# Patient Record
Sex: Female | Born: 1980 | Race: White | Hispanic: Yes | Marital: Married | Smoking: Never smoker
Health system: Southern US, Community
[De-identification: ages and names within clinical notes are randomized; demographics above are authoritative.]

## PROBLEM LIST (undated history)

## (undated) DIAGNOSIS — R42 Dizziness and giddiness: Secondary | ICD-10-CM

## (undated) DIAGNOSIS — D219 Benign neoplasm of connective and other soft tissue, unspecified: Secondary | ICD-10-CM

## (undated) DIAGNOSIS — A549 Gonococcal infection, unspecified: Secondary | ICD-10-CM

## (undated) HISTORY — PX: NO PAST SURGERIES: SHX2092

## (undated) HISTORY — DX: Gonococcal infection, unspecified: A54.9

---

## 2002-04-11 ENCOUNTER — Encounter: Payer: Self-pay | Admitting: *Deleted

## 2002-04-11 ENCOUNTER — Emergency Department (HOSPITAL_COMMUNITY): Admission: EM | Admit: 2002-04-11 | Discharge: 2002-04-11 | Payer: Self-pay | Admitting: *Deleted

## 2003-07-26 ENCOUNTER — Emergency Department (HOSPITAL_COMMUNITY): Admission: EM | Admit: 2003-07-26 | Discharge: 2003-07-26 | Payer: Self-pay | Admitting: Emergency Medicine

## 2005-04-30 ENCOUNTER — Ambulatory Visit (HOSPITAL_COMMUNITY): Admission: AD | Admit: 2005-04-30 | Discharge: 2005-04-30 | Payer: Self-pay | Admitting: Obstetrics and Gynecology

## 2005-05-01 ENCOUNTER — Inpatient Hospital Stay (HOSPITAL_COMMUNITY): Admission: RE | Admit: 2005-05-01 | Discharge: 2005-05-02 | Payer: Self-pay | Admitting: Obstetrics and Gynecology

## 2005-10-15 ENCOUNTER — Emergency Department (HOSPITAL_COMMUNITY): Admission: EM | Admit: 2005-10-15 | Discharge: 2005-10-15 | Payer: Self-pay | Admitting: Emergency Medicine

## 2005-10-24 ENCOUNTER — Emergency Department (HOSPITAL_COMMUNITY): Admission: EM | Admit: 2005-10-24 | Discharge: 2005-10-24 | Payer: Self-pay | Admitting: Emergency Medicine

## 2006-07-07 ENCOUNTER — Emergency Department (HOSPITAL_COMMUNITY): Admission: EM | Admit: 2006-07-07 | Discharge: 2006-07-08 | Payer: Self-pay | Admitting: Emergency Medicine

## 2006-12-12 ENCOUNTER — Other Ambulatory Visit: Admission: RE | Admit: 2006-12-12 | Discharge: 2006-12-12 | Payer: Self-pay | Admitting: Obstetrics and Gynecology

## 2007-06-28 ENCOUNTER — Inpatient Hospital Stay (HOSPITAL_COMMUNITY): Admission: AD | Admit: 2007-06-28 | Discharge: 2007-06-29 | Payer: Self-pay | Admitting: Gynecology

## 2007-06-28 ENCOUNTER — Ambulatory Visit: Payer: Self-pay | Admitting: Gynecology

## 2008-11-04 IMAGING — CT CT ABDOMEN W/O CM
2 of 4 series · 13 of 32 positions shown, 18 images · IV contrast (agent unspecified)
Comparison: none

CLINICAL DATA: Right abdominal pain.  Nausea and vomiting. 
 ABDOMEN CT WITHOUT CONTRAST:
TECHNIQUE: Multidetector CT imaging of the abdomen was performed following the standard protocol without IV contrast.   No IV or oral contrast was administered.
TECHNIQUE: Multidetector CT imaging of the pelvis was performed following the standard protocol without IV contrast.

[Series 2: abd/pel without · axial · non-contrast · 0.70mm/px · z∈[-369,-69]mm · 5 of 90 slices shown, 10 images]
[im 15/90  soft-tissue]
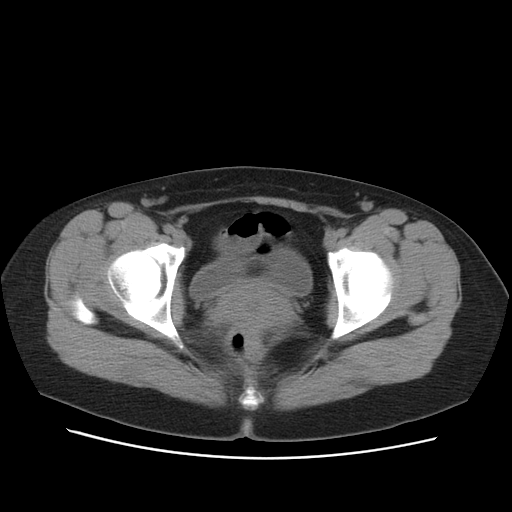
[im 15/90  bone]
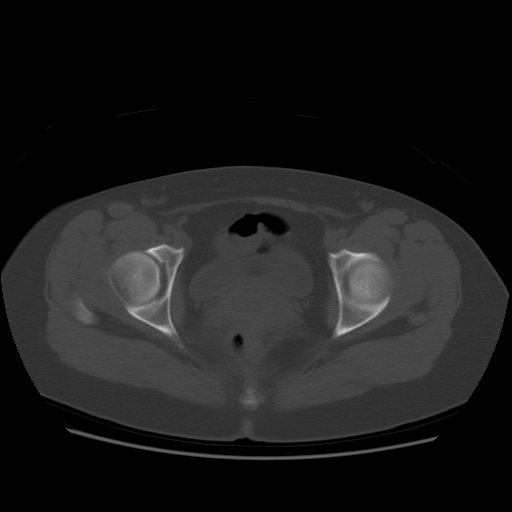
[im 30/90  soft-tissue]
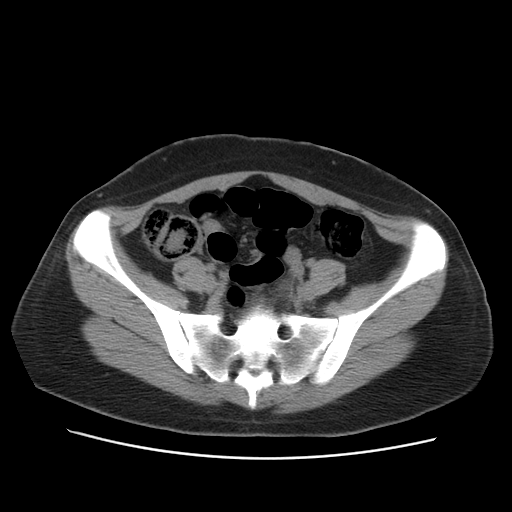
[im 30/90  lung]
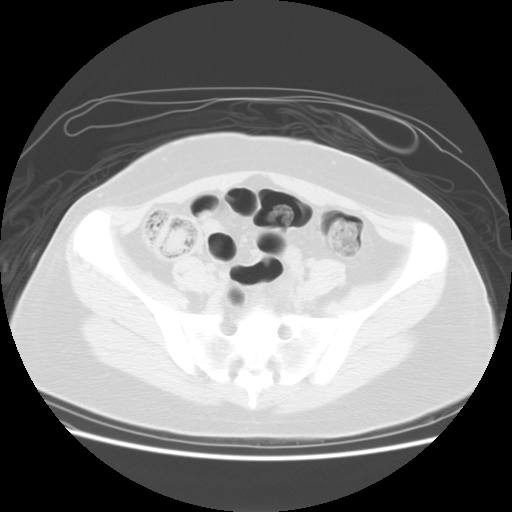
[im 45/90  soft-tissue]
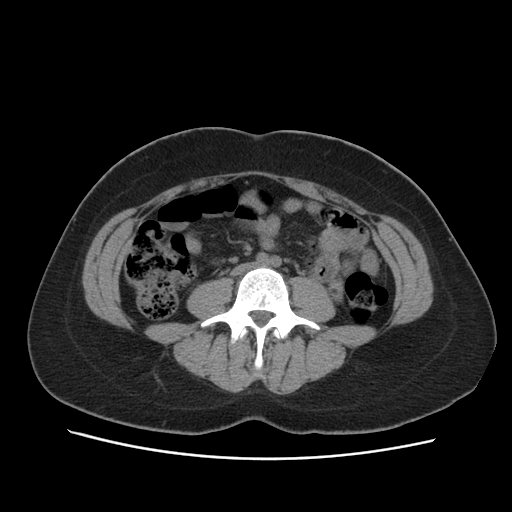
[im 45/90  lung]
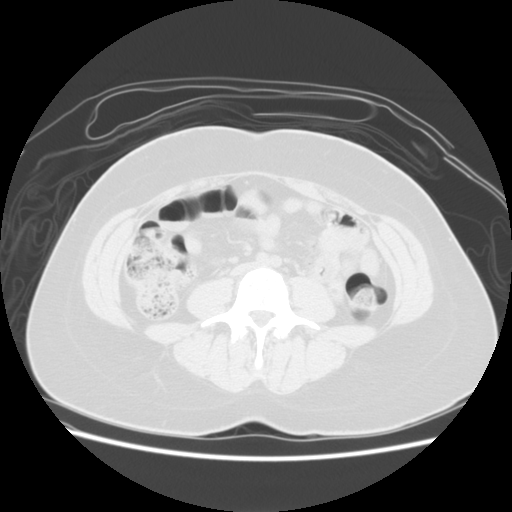
[im 60/90  soft-tissue]
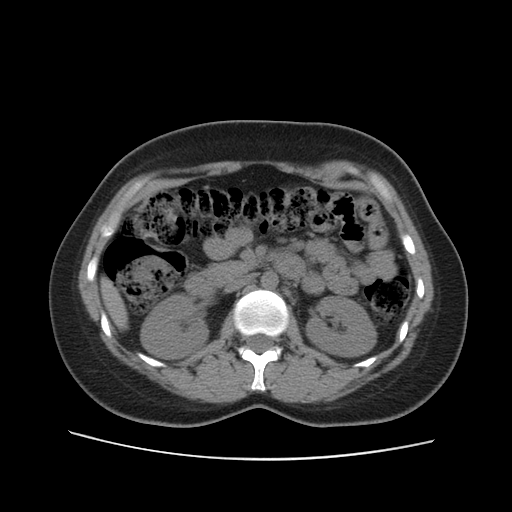
[im 60/90  lung]
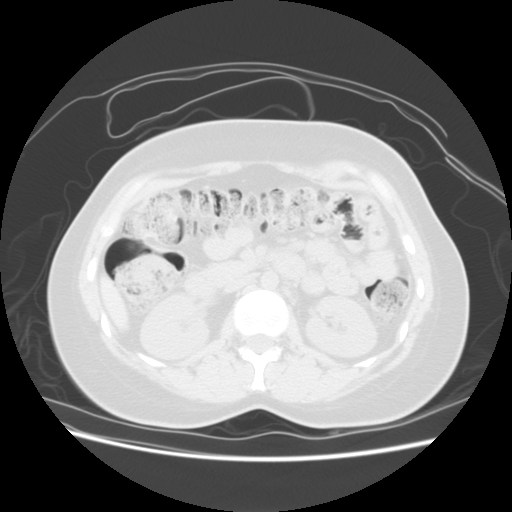
[im 75/90  soft-tissue]
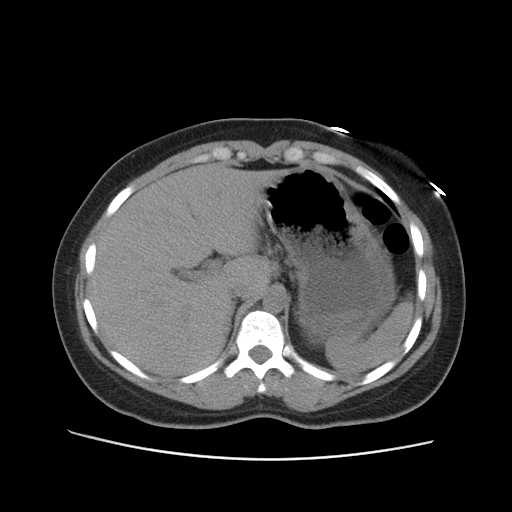
[im 75/90  lung]
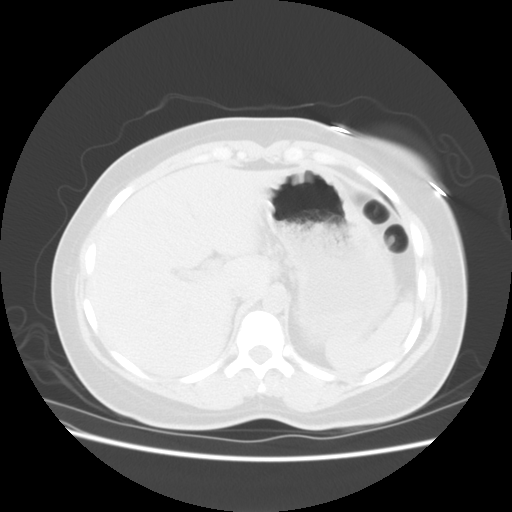

[Series 400: sag · sagittal · 0.70mm/px · 8 of 163 slices shown]
[im 13/163  soft-tissue]
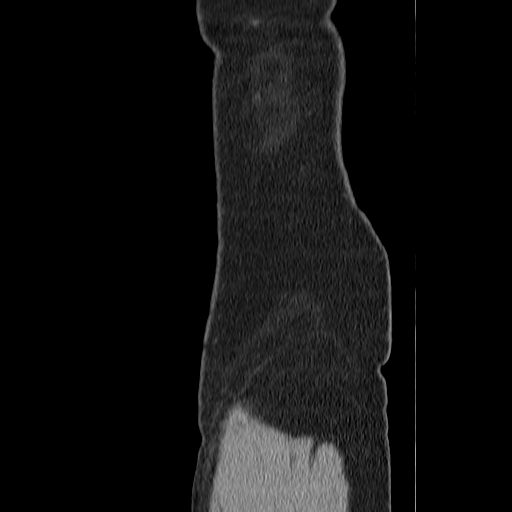
[im 38/163  soft-tissue]
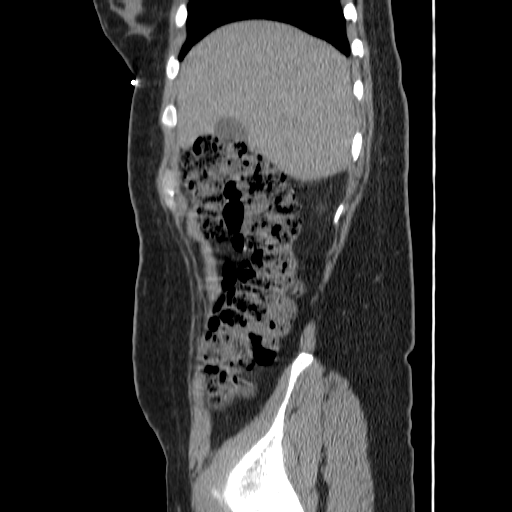
[im 50/163  soft-tissue]
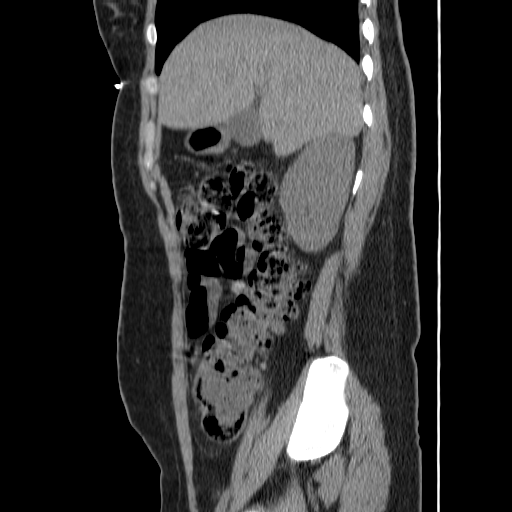
[im 75/163  soft-tissue]
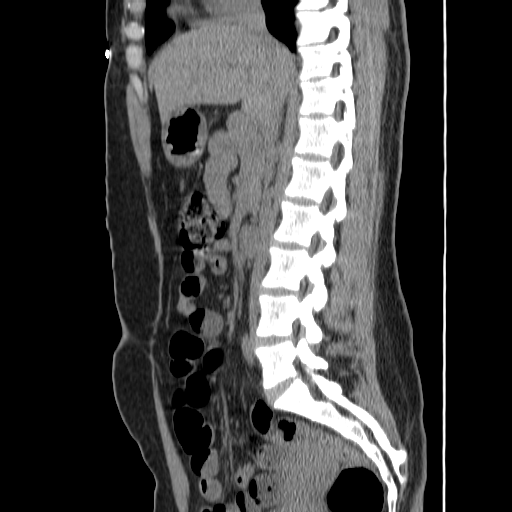
[im 88/163  soft-tissue]
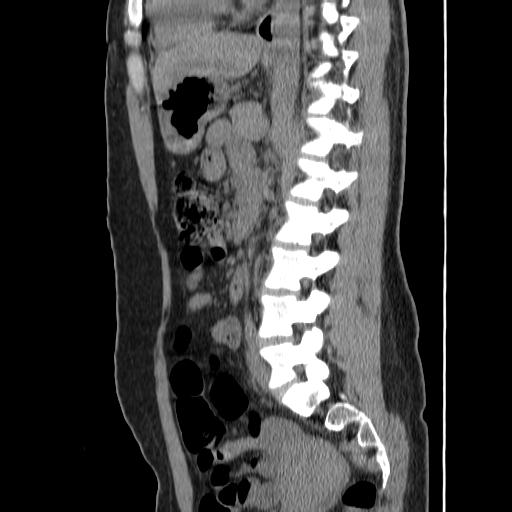
[im 113/163  soft-tissue]
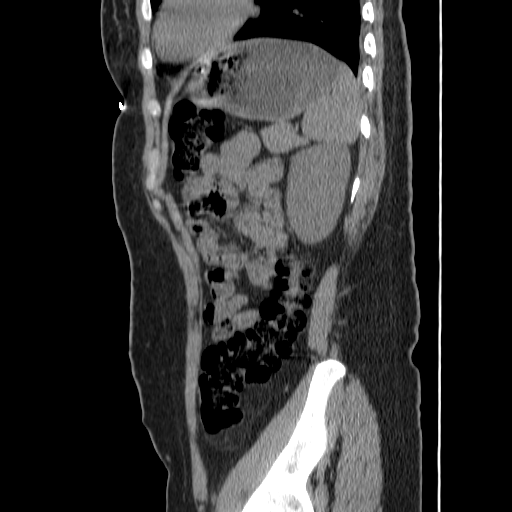
[im 125/163  soft-tissue]
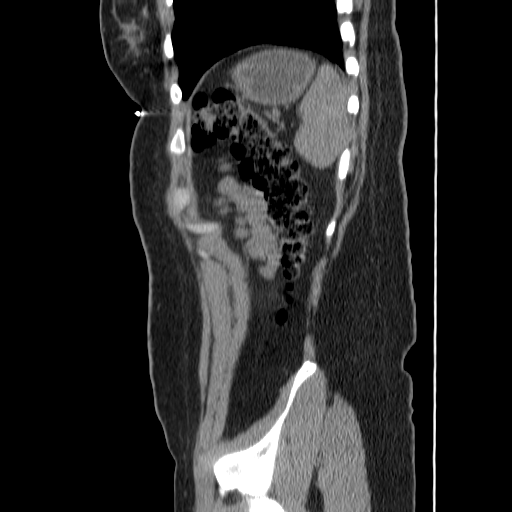
[im 150/163  soft-tissue]
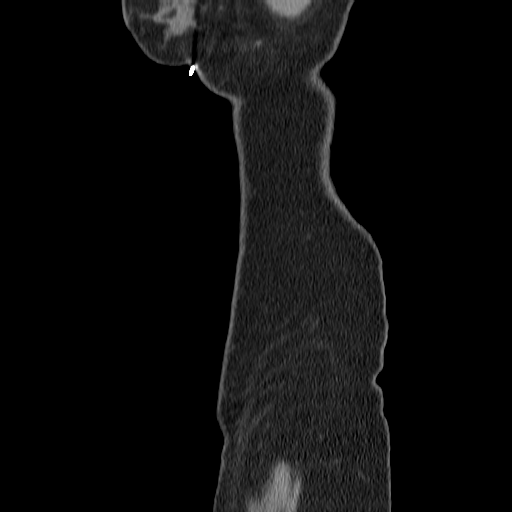

[13 of 32 positions shown; findings below may reference images not displayed]

FINDINGS: Visualized lung bases are unremarkable.  Unenhanced appearance of the solid organs in the abdomen including the liver, gallbladder, pancreas, spleen, adrenal glands, and kidneys are within normal limits.  No renal calculi or obstruction.  No focal inflammatory process is seen.  No free fluid or abscess.  No incidental masses are identified.  Bowel loops are of normal caliber without obstruction.
IMPRESSION: No acute findings on unenhanced abdominal CT. 
 PELVIS CT WITHOUT CONTRAST:
FINDINGS: No abnormal calcifications.  Bladder is unremarkable.  Pelvic bowel loops are of normal caliber.  No focal inflammatory process is seen.  Uterus and adnexal structures are unremarkable by CT.  No free fluid or hernia.
IMPRESSION: No acute findings in the pelvis.

## 2009-03-01 ENCOUNTER — Encounter: Admission: RE | Admit: 2009-03-01 | Discharge: 2009-03-01 | Payer: Self-pay | Admitting: Geriatric Medicine

## 2010-04-24 ENCOUNTER — Encounter: Payer: Self-pay | Admitting: Geriatric Medicine

## 2010-12-26 LAB — RPR: RPR Ser Ql: NONREACTIVE

## 2010-12-26 LAB — CBC
HCT: 38.3
Hemoglobin: 12.4
Hemoglobin: 12.9
MCHC: 33.9
MCV: 84.7
Platelets: 207
RBC: 4.33
RBC: 4.52
WBC: 11.9 — ABNORMAL HIGH
WBC: 12.1 — ABNORMAL HIGH

## 2011-01-12 ENCOUNTER — Other Ambulatory Visit (HOSPITAL_COMMUNITY)
Admission: RE | Admit: 2011-01-12 | Discharge: 2011-01-12 | Disposition: A | Discharge status: Home / Self Care | Payer: 59 | Source: Ambulatory Visit | Attending: Gynecology | Admitting: Gynecology

## 2011-01-12 ENCOUNTER — Ambulatory Visit (INDEPENDENT_AMBULATORY_CARE_PROVIDER_SITE_OTHER): Payer: 59 | Admitting: Gynecology

## 2011-01-12 ENCOUNTER — Encounter: Payer: Self-pay | Admitting: Gynecology

## 2011-01-12 VITALS — BP 140/86 | Ht 59.5 in | Wt 128.0 lb

## 2011-01-12 DIAGNOSIS — Z01419 Encounter for gynecological examination (general) (routine) without abnormal findings: Secondary | ICD-10-CM | POA: Insufficient documentation

## 2011-01-12 DIAGNOSIS — Z113 Encounter for screening for infections with a predominantly sexual mode of transmission: Secondary | ICD-10-CM

## 2011-01-12 NOTE — Progress Notes (Signed)
Tamara Kaiser 1981-01-15 [id number]   History:    30 y.o.  for annual exam new patient to the practice. Patient stated that last week she was being evaluated by family doctor because of her symptoms of borderline hypertension and dizziness. A full battery of blood tests were drawn results pending at time of this dictation. Patient states she's always had normal Pap smear her last gynecological examination was in 2011. She frequently does result this examination. Her menstrual cycles are regular. She is using condoms for contraception.  Past medical history,surgical history, family history and social history were all reviewed and documented in the EPIC chart.  ROS:  Was performed and pertinent positives and negatives are included in the history.  Exam: chaperone present BP 140/86  Ht 4' 11.5" (1.511 m)  Wt 128 lb (58.06 kg)  BMI 25.42 kg/m2  LMP 01/05/2011  Body mass index is 25.42 kg/(m^2).  General appearance : Well developed well nourished female. No acute distress HEENT: Neck supple, trachea midline, no carotid bruits, no thyroidmegaly Lungs: Clear to auscultation, no rhonchi or wheezes, or rib retractions  Heart: Regular rate and rhythm, no murmurs or gallops Breast:Examined in sitting and supine position were symmetrical in appearance, no palpable masses or tenderness,  no skin retraction, no nipple inversion, no nipple discharge, no skin discoloration, no axillary or supraclavicular lymphadenopathy Abdomen: no palpable masses or tenderness, no rebound or guarding Extremities: no edema or skin discoloration or tenderness  Pelvic:  Bartholin, Urethra, Skene Glands: Within normal limits             Vagina: No gross lesions or discharge  Cervix: No gross lesions or discharge  Uterus  anteverted, normal size, shape and consistency, non-tender and mobile  Adnexa  Without masses or tenderness  Anus and perineum  normal   Rectovaginal  normal sphincter tone without palpated  masses or tenderness             Hemoccult not done     Assessment/Plan:  30 y.o. female for annual exam unremarkable. Patient is instructed to continue monthly self breast examination. I have asked her to ask her primary physician to afford [place] a copy of her labs. Test was done today. She is instructed take calcium and vitamin D for osteoporosis prevention. We'll see her back in one year or when necessary.    Ok Edwards MD, 12:30 PM 01/12/2011

## 2011-01-19 ENCOUNTER — Telehealth: Payer: Self-pay | Admitting: *Deleted

## 2011-01-19 DIAGNOSIS — A549 Gonococcal infection, unspecified: Secondary | ICD-10-CM

## 2011-01-19 NOTE — Telephone Encounter (Signed)
Called pt to tell her lab result form office visit 01/12/11, but pt phone was not working.

## 2011-01-20 ENCOUNTER — Encounter: Payer: Self-pay | Admitting: *Deleted

## 2011-01-23 ENCOUNTER — Encounter: Payer: Self-pay | Admitting: *Deleted

## 2011-01-23 MED ORDER — AZITHROMYCIN 1 G PO PACK: 1.0000 | PACK | Freq: Once | ORAL | Status: AC

## 2011-01-23 MED ORDER — CEFIXIME 400 MG PO TABS: 400.0000 mg | ORAL_TABLET | Freq: Every day | ORAL | Status: AC

## 2011-01-23 NOTE — Telephone Encounter (Signed)
Pt has been informed with + lab results per blanca, rx sent to pharmacy, pt informed no sex and test of cure office visit scheduled and treatment for pt as well.

## 2011-02-12 DIAGNOSIS — A549 Gonococcal infection, unspecified: Secondary | ICD-10-CM

## 2011-02-12 HISTORY — DX: Gonococcal infection, unspecified: A54.9

## 2011-02-13 ENCOUNTER — Encounter: Payer: Self-pay | Admitting: Gynecology

## 2011-02-13 ENCOUNTER — Ambulatory Visit (INDEPENDENT_AMBULATORY_CARE_PROVIDER_SITE_OTHER): Payer: 59 | Admitting: Gynecology

## 2011-02-13 VITALS — BP 118/72

## 2011-02-13 DIAGNOSIS — Z113 Encounter for screening for infections with a predominantly sexual mode of transmission: Secondary | ICD-10-CM

## 2011-02-13 DIAGNOSIS — Z708 Other sex counseling: Secondary | ICD-10-CM

## 2011-02-13 NOTE — Progress Notes (Signed)
Patient was seen in the office on October 11 and had a GC and Chlamydia culture and RPR test done the time of her annual exam. See previous encounter note. A GC culture came back positive patient was notified was placed on Zithromax 1 g in Suprax 400 mg. She was asked to return to the office 2 weeks later for test of cure. Her RPR was negative her Pap smear was normal. She had her husband tested and she has informed me that they have told that his GC culture and Chlamydia HIV and RPR and hepatitis were all tests are negative. She states that he has not been unfaithful and she was concerned about the positive GC report. She is asymptomatic today we repeated the GC Chlamydia culture today for the test of cure and she will stop by the labs we can check an HIV along with hepatitis B and hepatitis C. As much as I can tell her as a result of this positive GC culture whether it's a false positive or lab error? Nevertheless there was no vomiting both been treated and she'll return back to the office if she has any symptoms. Will notify her when the results become available later this week.

## 2011-02-13 NOTE — Progress Notes (Signed)
Addended by: Landis Martins R on: 02/13/2011 12:22 PM   Modules accepted: Orders

## 2011-02-14 ENCOUNTER — Ambulatory Visit (INDEPENDENT_AMBULATORY_CARE_PROVIDER_SITE_OTHER): Payer: 59 | Admitting: Gynecology

## 2011-02-14 DIAGNOSIS — Z113 Encounter for screening for infections with a predominantly sexual mode of transmission: Secondary | ICD-10-CM

## 2011-02-14 NOTE — Progress Notes (Signed)
Addended byCammie Mcgee T on: 02/14/2011 11:50 AM   Modules accepted: Orders

## 2011-02-15 LAB — HEPATITIS C ANTIBODY: HCV Ab: NEGATIVE

## 2011-02-15 LAB — HEPATITIS B SURFACE ANTIGEN: Hepatitis B Surface Ag: NEGATIVE

## 2011-06-29 IMAGING — US US ABDOMEN COMPLETE
1 series · 14 of 25 positions shown · non-contrast
Comparison: CT abdomen pelvis 07/08/2006

CLINICAL DATA: Right upper quadrant pain with nausea and vomiting
after meals.

COMPLETE ABDOMINAL ULTRASOUND

[Series 1: us abdomen complete · 0.24mm/px · 14 of 64 slices shown]
[im 1/64]
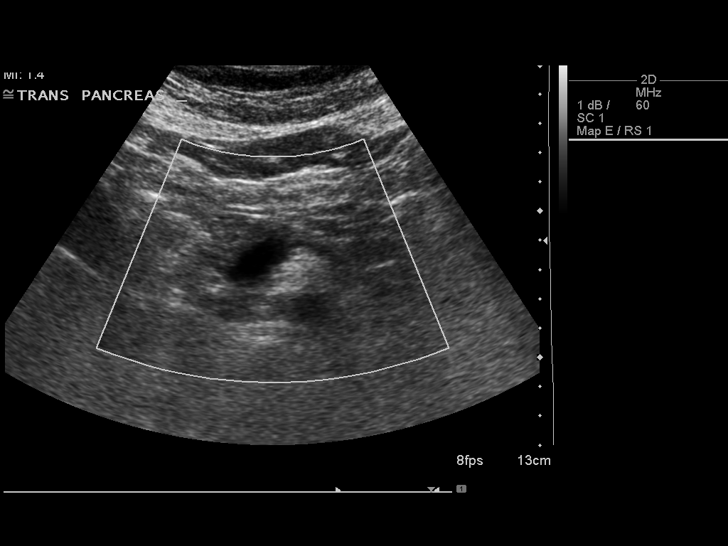
[im 6/64]
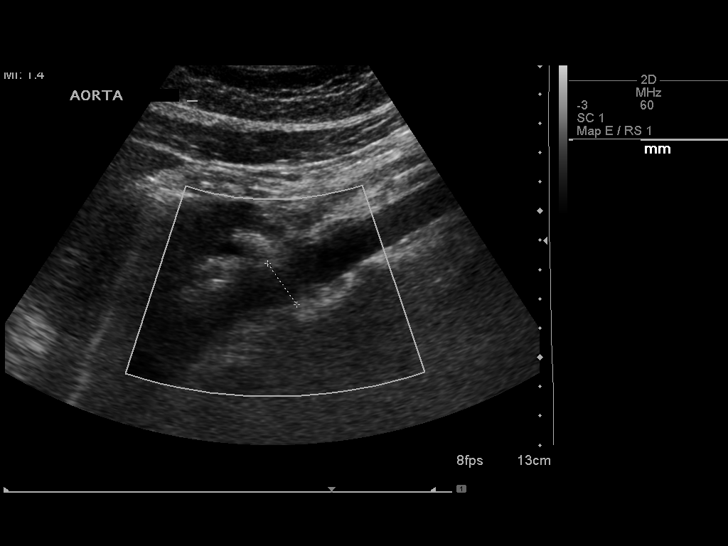
[im 11/64]
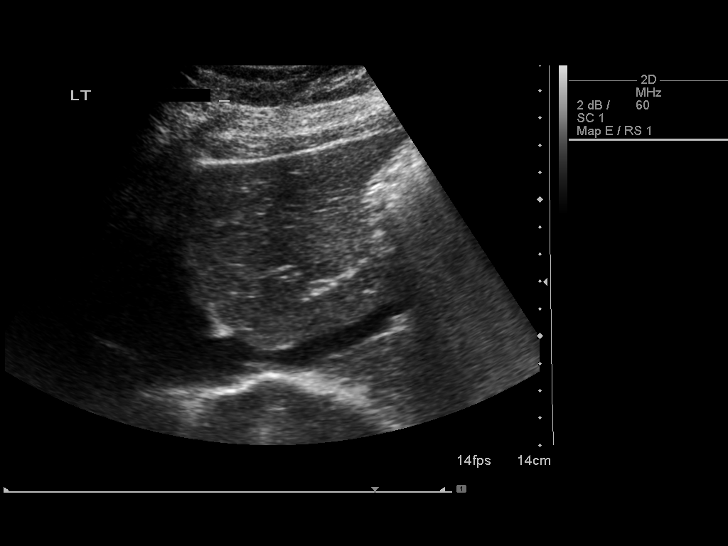
[im 16/64]
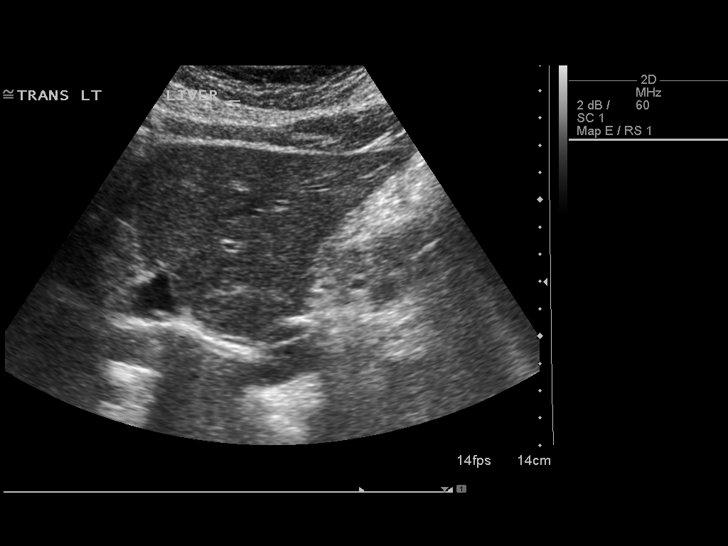
[im 22/64]
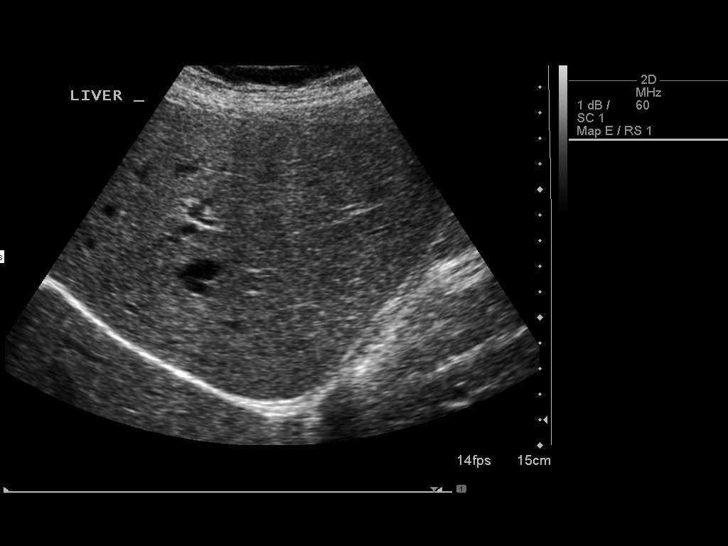
[im 24/64]
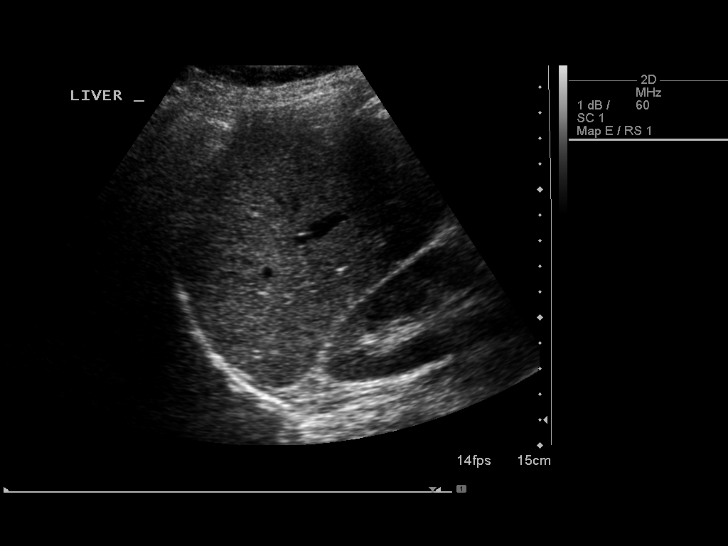
[im 29/64]
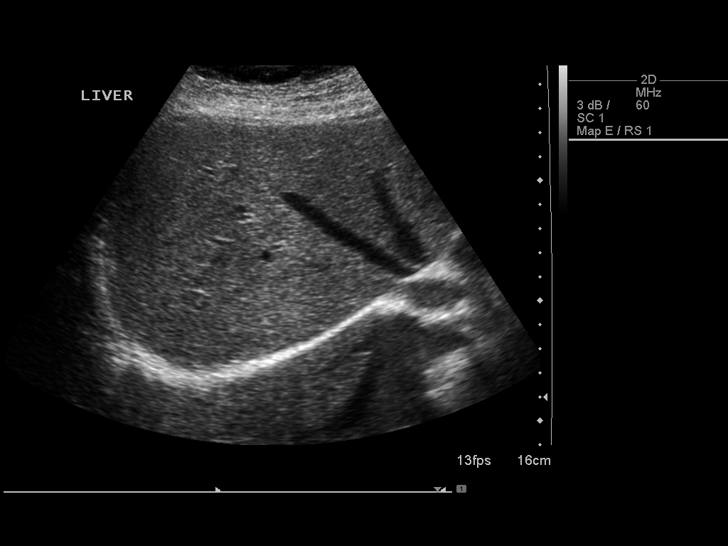
[im 35/64]
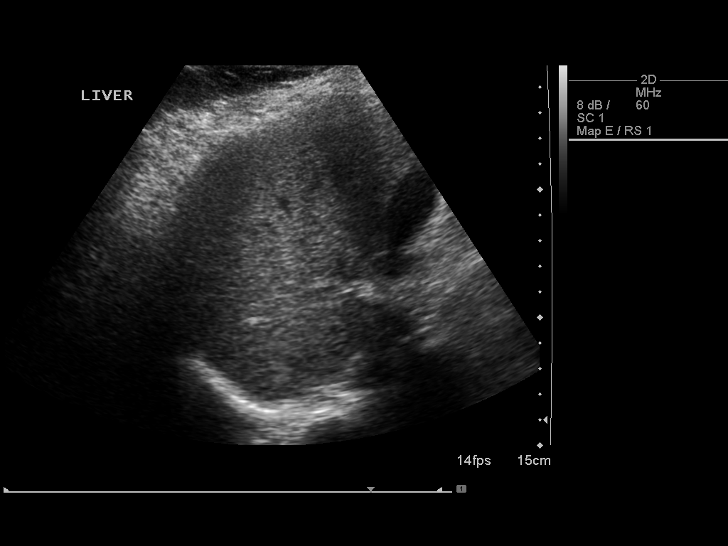
[im 40/64]
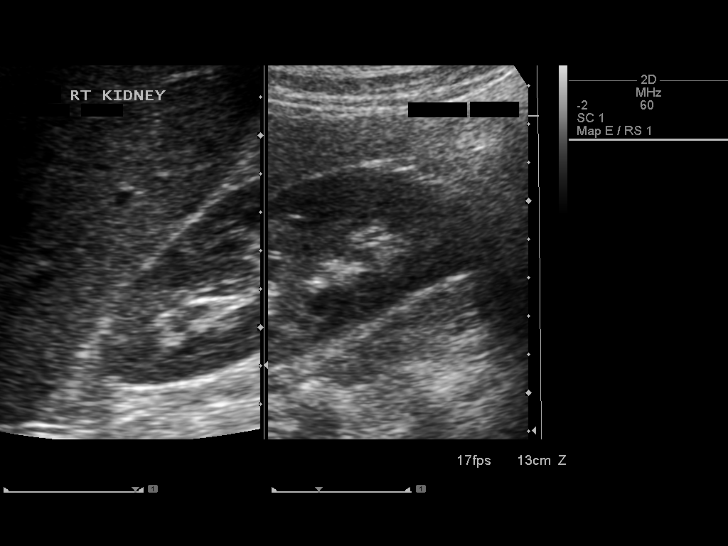
[im 43/64]
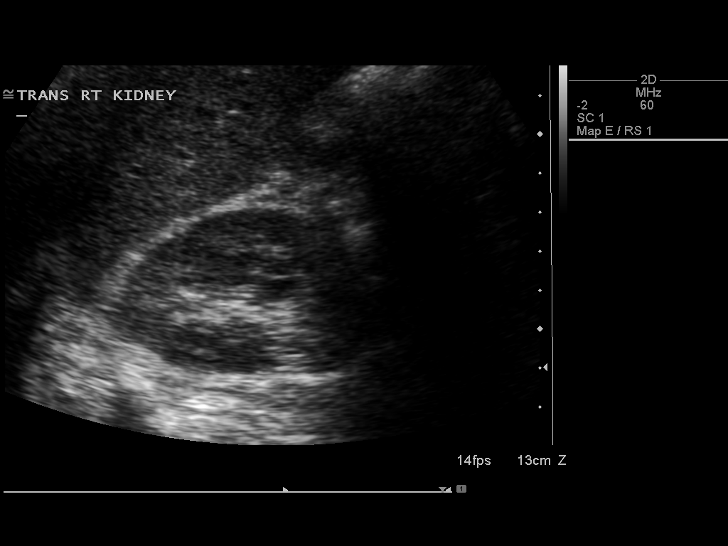
[im 48/64]
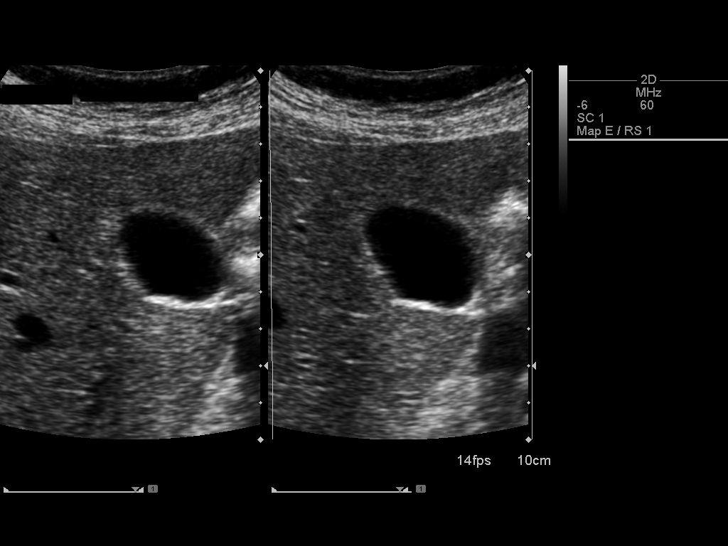
[im 53/64]
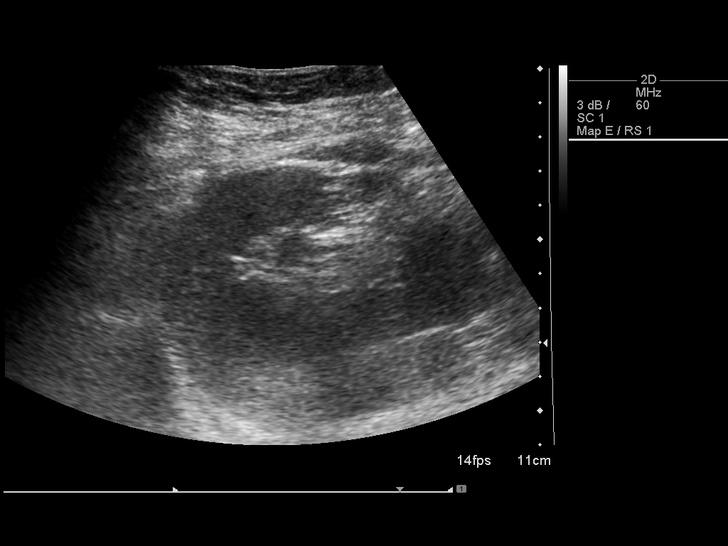
[im 58/64]
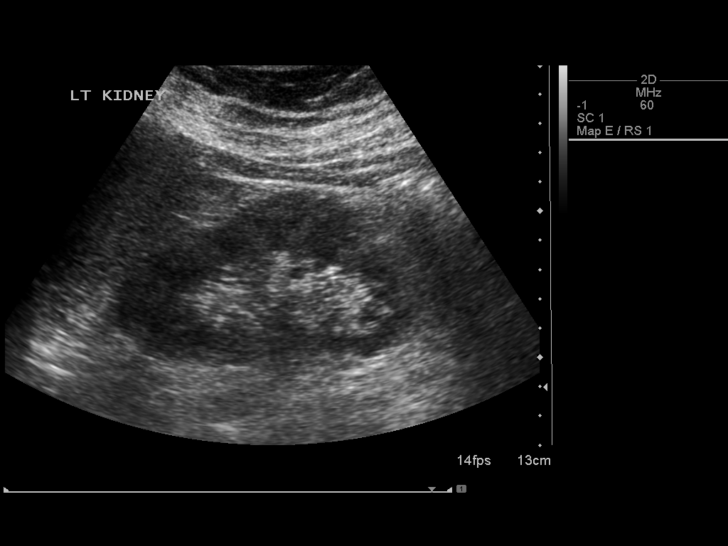
[im 64/64]
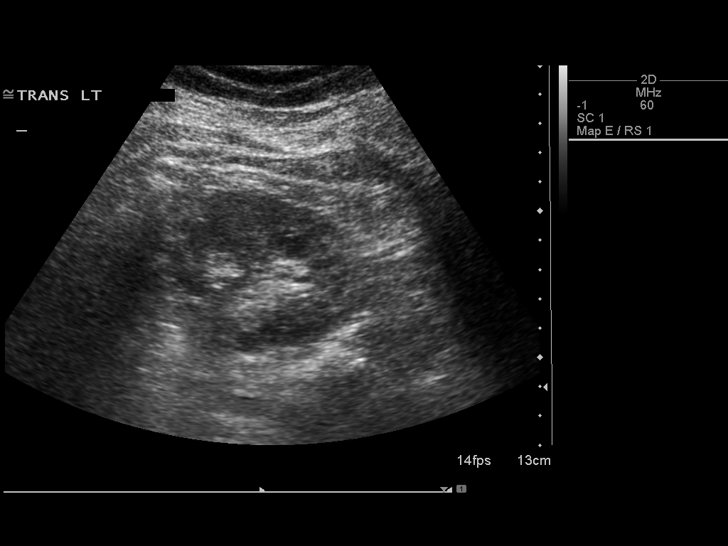

[14 of 25 positions shown; findings below may reference images not displayed]

FINDINGS: Gallbladder:  Negative.

Common bile duct:  Measures 3 mm, within normal limits.

Liver:  Negative.

IVC:  Visualized.

Pancreas:  Negative.

Spleen:  Measures 4.0 cm, negative.

Right Kidney:  Measures 10.7 cm, negative.

Left Kidney:  Measures 10.8 cm, negative.

Abdominal aorta:  No aneurysm identified.
IMPRESSION: Negative abdominal ultrasound.

## 2012-01-17 ENCOUNTER — Encounter: Payer: 59 | Admitting: Gynecology

## 2012-01-24 ENCOUNTER — Ambulatory Visit (INDEPENDENT_AMBULATORY_CARE_PROVIDER_SITE_OTHER): Payer: 59 | Admitting: Gynecology

## 2012-01-24 ENCOUNTER — Encounter: Payer: Self-pay | Admitting: Gynecology

## 2012-01-24 VITALS — BP 110/70 | Ht 59.25 in | Wt 141.0 lb

## 2012-01-24 DIAGNOSIS — R635 Abnormal weight gain: Secondary | ICD-10-CM

## 2012-01-24 DIAGNOSIS — Z01419 Encounter for gynecological examination (general) (routine) without abnormal findings: Secondary | ICD-10-CM

## 2012-01-24 DIAGNOSIS — Z833 Family history of diabetes mellitus: Secondary | ICD-10-CM

## 2012-01-24 DIAGNOSIS — Z113 Encounter for screening for infections with a predominantly sexual mode of transmission: Secondary | ICD-10-CM

## 2012-01-24 LAB — CHOLESTEROL, TOTAL: Cholesterol: 222 mg/dL — ABNORMAL HIGH (ref 0–200)

## 2012-01-24 LAB — CBC WITH DIFFERENTIAL/PLATELET
Basophils Relative: 0 % (ref 0–1)
HCT: 41.3 % (ref 36.0–46.0)
Hemoglobin: 13.8 g/dL (ref 12.0–15.0)
Lymphocytes Relative: 43 % (ref 12–46)
Monocytes Absolute: 0.6 10*3/uL (ref 0.1–1.0)
Monocytes Relative: 8 % (ref 3–12)
Neutro Abs: 3.7 10*3/uL (ref 1.7–7.7)
Neutrophils Relative %: 48 % (ref 43–77)
RBC: 4.69 MIL/uL (ref 3.87–5.11)
WBC: 7.7 10*3/uL (ref 4.0–10.5)

## 2012-01-24 LAB — HEMOGLOBIN A1C
Hgb A1c MFr Bld: 5.7 % — ABNORMAL HIGH (ref <5.7)
Mean Plasma Glucose: 117 mg/dL — ABNORMAL HIGH (ref <117)

## 2012-01-24 NOTE — Patient Instructions (Signed)
Auto examen de mama (Breast Self-Exam) El auto examen puede ayudarla a encontrar modificaciones o trastornos en la mama cuando todava son pequeos. Haga el auto examen de la mama:  [employer].   Una semana despus de su perodo (ciclo menstrual o periodo menstrual).   El Social worker de cada mes, si ya no tiene el perodo.  Debe estar atenta a:  Cambios en el color, tamao o forma de la mama.   Hoyuelos en los senos.   Modificaciones en los pezones o la piel.   Sequedad en la piel de los senos o los pezones.   Secreciones acuosas o sanguinolentas en los pezones.  Trate de sentir la presencia de:  Bultos.   Durezas.   Cualquier otro cambio.  CUIDADOS EN EL HOGAR  Hay 3 formas de hacer el examen autoexamen de mama: Prese frente a un espejo.  Levante los brazos por arriba de la cabeza y gire de un lado al otro.   Coloque las [employer] caderas e inclnese hacia abajo y luego gire de un lado al otro.   Inclnese hacia delante y gire de un lado al otro.  En la ducha.  Con las manos enjabonadas, revise los [employer]. Luego controle por [place] y por debajo de la clavcula y las [place].   Pase los dedos por la zona superior e inferior de la clavcula hasta debajo del seno, y desde el centro del pecho hasta el borde exterior de Management consultant. Controle si hay bultos o zonas duras.   Utilizando las yemas de tres dedos del medio revise todo su seno presionando la mano sobre el [place], haciendo crculos o movimientos hacia arriba y [employer].  Acostada.  Acustese sobre su cama.   Coloque una almohada pequea debajo de la mama que va a Chief Operating Officer. En esa misma posicin, ponga la mano detrs de la cabeza.   Con la Yahoo, use los 3 dedos del medio para Public house manager.   Mueva los dedos en un crculo alrededor de la mama. Presione firmemente sobre todas las partes de la mama para Engineer, manufacturing bultos.  SOLICITE AYUDA DE INMEDIATO SI: Encuentra algn cambio para que puedan  realizarle un estudio. Document Released: 04/22/2010 Document Revised: 03/09/2011 [facility] Patient Information 2012 [place], [place].  Vacuna difteria/ttanos (Td) o [place] difteria, ttanos, tos convulsa (Tdap), Lo que debe saber (Tetanus, Diphtheria [Td] or Tetanus, Diphtheria, Pertussis [Tdap] Vaccine, What You Need to Know) PORQU VACUNARSE? El ttanos , la difteria y la tos ferina pueden ser enfermedades graves.  El TTANOS  (trismo) provoca la contraccin dolorosa y rigidez de los msculos, por lo general, en todo el cuerpo.   Puede causar la contraccin de los msculos de la cabeza y el cuello de modo que el enfermo no puede abrir la boca ni tragar., y en algunos casos, tampoco puede respirar.. El ttanos causa la muerte de 1 de cada 5 personas que se infectan.  LA DIFTERIA produce la formacin de una membrana gruesa que cubre el fondo de la garganta.  Puede causar problemas respiratorios, parlisis, insuficiencia cardaca, e incluso la muerte.  El PERTUSIS (tos [place]) causa ataques de tos intensa que pueden dificultar la respiracin, provocar vmitos e interrumpir el sueo.   Puede causar prdida de peso, incontinencia, fractura de [place], y desmayos por la intensa tos. Hasta de 2 de cada 100 adolescentes y 5 de cada 100 adultos que enferman de tos [place] deben ser hospitalizados o tienen complicaciones como la neumona y  la muerte.  Estas 3 enfermedades son provocadas por bacterias. La difteria y la tos Benetta Spar se [place] de persona a Social worker. El ttanos ingresa al organismo a travs de cortes, rasguos o heridas. En los [place] ocurran alrededor de 200 000 casos por ao de difteria y tos [place], antes de que existieran las [place], y tambin ocurran cientos de casos de ttanos. Desde la aparicin de las vacunas, el ttanos y la difteria han disminuido en alrededor del 99% y los casos de tos ferina disminuyeron aproximadamente el 92%.  Los nios menores de 6 aos deben  recibir la vacuna DTaP para estar protegidos contra estas tres enfermedades. Pero los Abbott Laboratories, los adolescentes y los adultos tambin necesitan proteccin. VACUNAS PARA ADOLESCENTES Y ADULTOS Vacunas Tdap y Td  Hay dos vacunas disponibles para proteger de estas enfermedades a nios a Glass blower/designer de los 7aos:   La vacuna Td fue utilizada durante muchos aos. Protege contra el ttanos y la difteria.   La vacuna Tdap fue autorizada en 2005. Es la primera vacuna para adolescentes y adultos que protege contra la tos ferina y el ttanos y la difteria.  Una dosis de refuerzo de la Td se recomienda cada 10 aos. La Tdap se aplica slo una vez.  QU VACUNA DEBO APLICARME Y CUANDO? Las edades de 7 a 18 aos  Dynegy 11 y los 12 aos se recomienda una dosis de Tdap. Esta dosis puede aplicarse desde los 7 aos en los nios que no han recibido una o ms dosis de DTaP anteriormente.   Los nios y adolescentes que no recibieron todas las dosis programadas de DTaP o DTP a los 7 aos deben completar la serie usando una combinacin de Td y Tdap.  Adultos de 19 aos o ms  [employer] adultos deben recibir una dosis de refuerzo de Td cada 10 aos. Los adultos de menos de 65 aos que nunca hayan recibido la Tdap deben reemplazarla por la siguiente dosis de refuerzo. Los adultos a partir de los 65 aos puedenrecibir una dosis de Tdap.   Los adultos (incluyendo las mujeres que podran quedar embarazadas y los adultos mayores de 65 aos) que tienen contacto cercano con un beb menor de 12 meses deben aplicarse una dosis de Tdap para proteger al beb de la tos [place].   Los trabajadores de la salud que tengan contacto directo con pacientes en hospitales o clnicas deben recibir una dosis de Tdap.  Proteccin despus de [place] herida  Es posible que una persona que tenga un corte o quemadura grave necesite una dosis de Td o Tdap para prevenir la infeccin por ttanos. Puede usarse la Tdap en personas que nunca  recibieron una dosis. Pero debe usarse la Td, si la Tdap no se encuentra disponible, o para:   Cualquier persona que haya recibido una dosis de Tdap.   Los nios [employer] 7 y los 9 aos que han [employer].H. [employer] series de DTap anteriormente.   Adultos de 65 aos o ms.  Mujeres embarazadas.   Las mujeres embarazadas que nunca recibieron una dosis de Ddap deben recibirla despus de la 20a semana de gestacin y preferiblemente durante Contractor. trimestre. Si no se aplican la Tdap durante el embarazo, deben recibirla lo antes posible despus del parto. Las mujeres embarazadas que han recibido la Tdap y tienen que aplicarse la vacuna contra el ttanos o la difteria durante el [place], deben recibir la Td.  Las vacunas Tdap y Td pueden ser administradas al  mismo tiempo que otras vacunas. ALGUNAS PERSONAS NO DEBEN RECIBIR LA VACUNA O DEBEN [employer]lett[employer]ard  Las personas que hayan tenido una reaccin alrgica que haya puesto en peligro su vida despus de una dosis de vacuna contra el ttanos, la difteria o la tos ferina no deben recibir Td ni Tdap..   Las personas que tengan alergias graves a algn componente de una vacuna no deben recibir esa vacuna. Informe a su mdico si la persona que recibe la vacuna sufre alergias graves.   Cualquier persona que American Standard Companies en coma o que haya tenido convulsiones dentro de los 7 [address] posteriores despus de una dosis de DTP o DTaP no debe recibir la Tdap, salvo que se encuentre una causa que no fuera la vacuna. Estas personas pueden recibir Td.   Consulte a su mdico si la persona que recibe [place] de las vacunas:   Tiene epilepsia o algn otro problema del sistema nervioso.   Tuvo inflamacin o dolor intenso despus de una dosis de DTP, DTaP, DT, Td, o Tdap.   Ha tenido el sndrome de Scientific laboratory technician (GBS por sus siglas en ingls).  Las personas que sufran una enfermedad moderada o grave el da en que se programa la vacuna, deben esperar a recuperarse para recibir las  vacunas Tdap o Td. Por lo general, una persona con una enfermedad leve o fiebre baja puede recibir la vacuna. CULES SON LOS RIESGOS DE LAS VACUNAS TDAP Y TD? Con Cathleen Corti, al igual que con cualquier Automatic Data, siempre existe un pequeo riesgo de una reaccin alrgica que ponga en peligro la vida o cause otro problema grave. Todo procedimiento mdico, inclusive la vacunacin pueden causar breves episodios de lipotimia o sntomas relacionados (como movimientos espasmdicos). Para evitar los [employer] y las lesiones causadas por las cadas, permanezca sentado o recustese durante los 15 minutos posteriores a la vacunacin. Informe a su mdico si el paciente se siente dbil o mareado, tiene cambios en la visin o siente zumbidos en los odos.  Es mucho ms probable que tener ttanos, difteria, o tos ferina cause problemas ms graves que los provocados por recibir cualquiera de las vacunas Td o Tdap. A continuacin se enumeran los problemas informados despus de las vacunas Td y Tdap. Problemas Leves (perceptibles, pero que no interfirieron con las actividades): Tdap  Dolor (alrededor de 3 de cada 4 adolescentes y 2 de cada 3 adultos).   Enrojecimiento o inflamacin en el sitio de la inyeccin (alrededor de 1 de cada 5).   Fiebre leve de al menos 100.4 F (38 C) (hasta alrededor de 1 cada 25 adolescentes y 1 de cada 100 adultos).   Dolor de cabeza (alrededor de 4 de cada 10 adolescentes y 3 de cada 10 adultos).   Cansancio (alrededor de 1 de cada 3 adolescentes y 1 de cada 4 adultos).   Nuseas, vmitos, diarrea, o dolor de estmago (hasta 1 de cada 4 adolescentes y 1 de cada 10 adultos).   Escalofros, dolores corporales, dolor articular, erupciones, o inflamacin de las glndulas (poco frecuente).  Td  Dolor (hasta alrededor de 8 de cada 10).   Enrojecimiento o inflamacin de la inyeccin (alrededor de 1 de cada 3).   Fiebre leve (hasta alrededor de 1 de cada 5).   Dolor de cabeza o  cansancio (poco frecuente).  Problemas Moderados (interfieren con las [place], [place] no requieren atencin mdica): Tdap  Dolor en el sitio de la inyeccin (alrededor de 1 de cada 20 adolescentes y 1 de cada 100  adultos).   Enrojecimiento o inflamacin de la inyeccin (alrededor de 1 de cada 16 adolescentes y 1 de cada 25 adultos).   Fiebre de ms de 102 F (38.9 C) (alrededor de 1 de cada 100 adolescentes y 1 de cada 250 adultos).   Dolor de cabeza (1 de cada 300).   Nuseas, vmitos, diarrea, o dolor de estmago (hasta 3 de cada 100 adolescentes y 1 de cada 100 adultos).  Td  Fiebre de ms de 102 F (38.9 C) (poco comn).  Tdap o Td  Inflamacin de gran extensin en el brazo en el que se aplic la vacuna (hasta 3 de cada 100).  Problemas [place] (no puede realizar [employer]; requiere Psychologist, prison and probation services) Tdap o Td  Inflamacin, dolor intenso, sangrado y enrojecimiento en el brazo, en el sitio de la inyeccin (poco frecuente).  Puede producirse una reaccin alrgica grave despus de cualquier vacuna. Se estima que estas reacciones ocurren en menos de una de cada un milln de dosis. QU PASA SI HAY UNA REACCIN GRAVE? Qu signos debo buscar? Cualquier estado poco habitual, como una reaccin alrgica grave o fiebre alta. Si le produce Runner, broadcasting/film/video grave, se manifestar dentro de algunos minutos a una hora despus de recibir la vacuna. Entre los signos de Automotive engineer grave se encuentran la dificultad para respirar, debilidad, ronquera o sibilancias, latidos cardacos acelerados, urticaria, mareos, palidez, o inflamacin de la garganta. Qu debo hacer?  Comunquese con su mdico o lleve inmediatamente a la persona al mdico.   Dgale a su mdico qu ocurri, la fecha y hora en que sucedi y cundo le aplicaron la vacuna.   Pida a su mdico que informe sobre la reaccin llenando un formulario del Sistema de Informacin de Reacciones Adversos a las Administrator, arts  (VAERS, por sus siglas en ingls). O, puede presentar este informe a travs del sitio web de VAERS [email or web address].[email or web address].no o puede llamar al [phone].  VAERS no brinda asistencia mdica. PROGRAMA NACIONAL DE COMPENSACIN DE DAOS POR VACUNAS El [place] de Compensacin de Daos por Vacunas (VICP) fue creado en 1986.  Aquellas personas que consideren que han sufrido un dao como consecuencia de una vacuna y quieren saber ms acerca del programa y como presentar Roslynn Amble, [place] llamar al [phone] o visitar su sitio web en SpiritualWord.[email or web address]  CMO Roxan Diesel MS INFORMACIN?  El profesional podr darle el prospecto de la vacuna o sugerirle otras fuentes de informacin.   Comunquese con el servicio de salud de su localidad o [address].   Comunquese con los Centros para el control y la prevencin de Child psychotherapist for Disease Control and Prevention , CDC).   Llame al [phone] (1-800-CDC-INFO).   Visite los sitios web de Energy Transfer Partners en PicCapture.uy  CDC Td and Tdap Interim VIS-Spanish (04/26/10) Document Released: 07/06/2008 Document Revised: 03/09/2011 [facility] Patient Information 2012 [place], [place].

## 2012-01-24 NOTE — Progress Notes (Signed)
Tamara Kaiser [birth year] [id number]   History:    31 y.o.  for annual gyn exam with no complaints today. She is using condoms for contraception. Her cycles are regular. She requests STD screen. She in frequently does her self breast examination. Patient with no prior history of normal Pap smear. Patient's sister is insulin-dependent diabetic.  Past medical history,surgical history, family history and social history were all reviewed and documented in the EPIC chart.  Gynecologic History Patient's last menstrual period was 01/18/2012. Contraception: condoms Last Pap: 2012. Results were: normal Last mammogram: Not indicated. Results were: Not indicated  Obstetric History OB History    Grav Para Term Preterm Abortions TAB SAB Ect Mult Living   2 2        2      # Outc Date GA Lbr Len/2nd Wgt Sex Del Anes PTL Lv   1 PAR     M SVD      2 PAR     F SVD          ROS: A ROS was performed and pertinent positives and negatives are included in the history.  GENERAL: No fevers or chills. HEENT: No change in vision, no earache, sore throat or sinus congestion. NECK: No pain or stiffness. CARDIOVASCULAR: No chest pain or pressure. No palpitations. PULMONARY: No shortness of breath, cough or wheeze. GASTROINTESTINAL: No abdominal pain, nausea, vomiting or diarrhea, melena or bright red blood per rectum. GENITOURINARY: No urinary frequency, urgency, hesitancy or dysuria. MUSCULOSKELETAL: No joint or muscle pain, no back pain, no recent trauma. DERMATOLOGIC: No rash, no itching, no lesions. ENDOCRINE: No polyuria, polydipsia, no heat or cold intolerance. No recent change in weight. HEMATOLOGICAL: No anemia or easy bruising or bleeding. NEUROLOGIC: No headache, seizures, numbness, tingling or weakness. PSYCHIATRIC: No depression, no loss of interest in normal activity or change in sleep pattern.     Exam: chaperone present  BP 110/70  Ht 4' 11.25" (1.505 m)  Wt 141 lb (63.957 kg)  BMI 28.24  kg/m2  LMP 01/18/2012  Body mass index is 28.24 kg/(m^2).  General appearance : Well developed well nourished female. No acute distress HEENT: Neck supple, trachea midline, no carotid bruits, no thyroidmegaly Lungs: Clear to auscultation, no rhonchi or wheezes, or rib retractions  Heart: Regular rate and rhythm, no murmurs or gallops Breast:Examined in sitting and supine position were symmetrical in appearance, no palpable masses or tenderness,  no skin retraction, no nipple inversion, no nipple discharge, no skin discoloration, no axillary or supraclavicular lymphadenopathy Abdomen: no palpable masses or tenderness, no rebound or guarding Extremities: no edema or skin discoloration or tenderness  Pelvic:  Bartholin, Urethra, Skene Glands: Within normal limits             Vagina: No gross lesions or discharge  Cervix: No gross lesions or discharge  Uterus  anteverted, normal size, shape and consistency, non-tender and mobile  Adnexa  Without masses or tenderness  Anus and perineum  normal   Rectovaginal  normal sphincter tone without palpated masses or tenderness             Hemoccult not indicated     Assessment/Plan:  31 y.o. female for annual exam with no abnormalities detected. We did discuss the new Pap smear screening guidelines. She will not need a Pap smear this year. We discussed the importance of monthly self breast examination as well as instruction and literature provided. GC and Chlamydia culture was obtained. The following labs were  ordered today: Hemoglobin A1c, total cholesterol, CBC, TSH and urinalysis. We discussed importance of regular exercise and proper nutrition to help her lose weight.    Ok Edwards MD, 11:58 AM 01/24/2012

## 2012-01-25 LAB — URINALYSIS W MICROSCOPIC + REFLEX CULTURE
Bilirubin Urine: NEGATIVE
Glucose, UA: NEGATIVE mg/dL
Hgb urine dipstick: NEGATIVE
Leukocytes, UA: NEGATIVE
Protein, ur: NEGATIVE mg/dL
Squamous Epithelial / LPF: NONE SEEN
Urobilinogen, UA: 0.2 mg/dL (ref 0.0–1.0)

## 2012-01-25 LAB — GC/CHLAMYDIA PROBE AMP, GENITAL
Chlamydia, DNA Probe: NEGATIVE
GC Probe Amp, Genital: NEGATIVE

## 2012-01-26 ENCOUNTER — Other Ambulatory Visit: Payer: Self-pay | Admitting: Anesthesiology

## 2012-01-26 DIAGNOSIS — R739 Hyperglycemia, unspecified: Secondary | ICD-10-CM

## 2012-01-26 DIAGNOSIS — E7800 Pure hypercholesterolemia, unspecified: Secondary | ICD-10-CM

## 2012-02-01 ENCOUNTER — Ambulatory Visit (INDEPENDENT_AMBULATORY_CARE_PROVIDER_SITE_OTHER): Payer: 59 | Admitting: Anesthesiology

## 2012-02-01 ENCOUNTER — Other Ambulatory Visit: Payer: 59

## 2012-02-01 DIAGNOSIS — R739 Hyperglycemia, unspecified: Secondary | ICD-10-CM

## 2012-02-01 DIAGNOSIS — Z23 Encounter for immunization: Secondary | ICD-10-CM

## 2012-02-01 DIAGNOSIS — E7800 Pure hypercholesterolemia, unspecified: Secondary | ICD-10-CM

## 2012-02-01 LAB — GLUCOSE, RANDOM: Glucose, Bld: 93 mg/dL (ref 70–99)

## 2012-02-01 LAB — LIPID PANEL
Cholesterol: 233 mg/dL — ABNORMAL HIGH (ref 0–200)
Total CHOL/HDL Ratio: 3.9 Ratio

## 2012-02-02 ENCOUNTER — Other Ambulatory Visit: Payer: Self-pay | Admitting: Anesthesiology

## 2012-02-02 DIAGNOSIS — E7800 Pure hypercholesterolemia, unspecified: Secondary | ICD-10-CM

## 2014-02-02 ENCOUNTER — Encounter: Payer: Self-pay | Admitting: Gynecology

## 2017-11-06 ENCOUNTER — Other Ambulatory Visit (HOSPITAL_COMMUNITY): Payer: Self-pay | Admitting: Nurse Practitioner

## 2017-11-06 DIAGNOSIS — Z369 Encounter for antenatal screening, unspecified: Secondary | ICD-10-CM

## 2017-11-08 ENCOUNTER — Other Ambulatory Visit (HOSPITAL_COMMUNITY): Payer: Self-pay | Admitting: Nurse Practitioner

## 2017-11-08 DIAGNOSIS — N858 Other specified noninflammatory disorders of uterus: Secondary | ICD-10-CM

## 2017-11-14 ENCOUNTER — Other Ambulatory Visit (HOSPITAL_COMMUNITY): Payer: Self-pay | Admitting: Nurse Practitioner

## 2017-11-14 ENCOUNTER — Ambulatory Visit (HOSPITAL_COMMUNITY)
Admission: RE | Admit: 2017-11-14 | Discharge: 2017-11-14 | Disposition: A | Discharge status: Home / Self Care | Payer: Medicaid Other | Source: Ambulatory Visit | Attending: Nurse Practitioner | Admitting: Nurse Practitioner

## 2017-11-14 DIAGNOSIS — Z3682 Encounter for antenatal screening for nuchal translucency: Secondary | ICD-10-CM | POA: Insufficient documentation

## 2017-11-14 DIAGNOSIS — N858 Other specified noninflammatory disorders of uterus: Secondary | ICD-10-CM

## 2017-11-14 DIAGNOSIS — Z3A11 11 weeks gestation of pregnancy: Secondary | ICD-10-CM | POA: Insufficient documentation

## 2017-11-21 ENCOUNTER — Encounter (HOSPITAL_COMMUNITY): Payer: Self-pay

## 2017-11-26 ENCOUNTER — Encounter (HOSPITAL_COMMUNITY): Payer: Self-pay | Admitting: *Deleted

## 2017-11-28 ENCOUNTER — Encounter (HOSPITAL_COMMUNITY): Payer: Self-pay

## 2017-11-28 ENCOUNTER — Ambulatory Visit (HOSPITAL_COMMUNITY)
Admission: RE | Admit: 2017-11-28 | Discharge: 2017-11-28 | Disposition: A | Discharge status: Home / Self Care | Payer: Medicaid Other | Source: Ambulatory Visit | Attending: Nurse Practitioner | Admitting: Nurse Practitioner

## 2017-11-28 ENCOUNTER — Other Ambulatory Visit (HOSPITAL_COMMUNITY): Payer: Self-pay

## 2017-11-28 ENCOUNTER — Ambulatory Visit (HOSPITAL_COMMUNITY): Payer: Medicaid Other

## 2017-11-28 DIAGNOSIS — O3410 Maternal care for benign tumor of corpus uteri, unspecified trimester: Secondary | ICD-10-CM

## 2017-11-28 DIAGNOSIS — Z3A13 13 weeks gestation of pregnancy: Secondary | ICD-10-CM

## 2017-11-28 DIAGNOSIS — Z3682 Encounter for antenatal screening for nuchal translucency: Secondary | ICD-10-CM | POA: Diagnosis present

## 2017-11-28 DIAGNOSIS — O3411 Maternal care for benign tumor of corpus uteri, first trimester: Secondary | ICD-10-CM | POA: Insufficient documentation

## 2017-11-28 DIAGNOSIS — Z369 Encounter for antenatal screening, unspecified: Secondary | ICD-10-CM

## 2017-11-28 DIAGNOSIS — O09521 Supervision of elderly multigravida, first trimester: Secondary | ICD-10-CM | POA: Insufficient documentation

## 2017-11-28 HISTORY — DX: Dizziness and giddiness: R42

## 2017-11-28 HISTORY — DX: Benign neoplasm of connective and other soft tissue, unspecified: D21.9

## 2017-11-28 NOTE — ED Notes (Signed)
Patient declined GC.

## 2017-11-28 NOTE — ED Notes (Signed)
Patient in session with Dietitian. Interpreter present from [employer].

## 2017-11-29 ENCOUNTER — Other Ambulatory Visit (HOSPITAL_COMMUNITY): Payer: Self-pay | Admitting: *Deleted

## 2017-11-29 DIAGNOSIS — O09522 Supervision of elderly multigravida, second trimester: Secondary | ICD-10-CM

## 2017-12-04 ENCOUNTER — Telehealth (HOSPITAL_COMMUNITY): Payer: Self-pay | Admitting: *Deleted

## 2017-12-04 NOTE — Telephone Encounter (Signed)
TC to patient with maternity21 result.  Negative result given, pt did not want to know gender.  Pt voiced understanding.

## 2018-01-02 ENCOUNTER — Encounter (HOSPITAL_COMMUNITY): Payer: Self-pay

## 2018-01-02 ENCOUNTER — Ambulatory Visit (HOSPITAL_COMMUNITY): Payer: Self-pay

## 2018-08-27 ENCOUNTER — Encounter (HOSPITAL_COMMUNITY): Payer: Self-pay
